# Patient Record
Sex: Male | Born: 1990 | Race: Black or African American | Hispanic: No | Marital: Single | Smoking: Current every day smoker
Health system: Southern US, Community
[De-identification: ages and names within clinical notes are randomized; demographics above are authoritative.]

## PROBLEM LIST (undated history)

## (undated) HISTORY — PX: HERNIA REPAIR: SHX51

---
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
## 2018-12-05 ENCOUNTER — Other Ambulatory Visit: Payer: Self-pay

## 2018-12-05 ENCOUNTER — Emergency Department: Payer: BC Managed Care – PPO

## 2018-12-05 ENCOUNTER — Emergency Department
Admission: EM | Admit: 2018-12-05 | Discharge: 2018-12-05 | Disposition: A | Discharge status: Home / Self Care | Payer: BC Managed Care – PPO | Attending: Emergency Medicine | Admitting: Emergency Medicine

## 2018-12-05 DIAGNOSIS — F17210 Nicotine dependence, cigarettes, uncomplicated: Secondary | ICD-10-CM | POA: Diagnosis not present

## 2018-12-05 DIAGNOSIS — R42 Dizziness and giddiness: Secondary | ICD-10-CM | POA: Insufficient documentation

## 2018-12-05 DIAGNOSIS — G441 Vascular headache, not elsewhere classified: Secondary | ICD-10-CM | POA: Diagnosis not present

## 2018-12-05 DIAGNOSIS — R51 Headache: Secondary | ICD-10-CM | POA: Diagnosis present

## 2018-12-05 LAB — CBC
HCT: 49.3 % (ref 39.0–52.0)
Hemoglobin: 16.6 g/dL (ref 13.0–17.0)
MCH: 29.3 pg (ref 26.0–34.0)
MCHC: 33.7 g/dL (ref 30.0–36.0)
MCV: 86.9 fL (ref 80.0–100.0)
Platelets: 227 10*3/uL (ref 150–400)
RBC: 5.67 MIL/uL (ref 4.22–5.81)
RDW: 12.9 % (ref 11.5–15.5)
WBC: 5.2 10*3/uL (ref 4.0–10.5)
nRBC: 0 % (ref 0.0–0.2)

## 2018-12-05 LAB — BASIC METABOLIC PANEL
Anion gap: 8 (ref 5–15)
BUN: 13 mg/dL (ref 6–20)
CO2: 25 mmol/L (ref 22–32)
Calcium: 9.2 mg/dL (ref 8.9–10.3)
Chloride: 109 mmol/L (ref 98–111)
Creatinine, Ser: 1.17 mg/dL (ref 0.61–1.24)
GFR calc Af Amer: >60 mL/min (ref >60)
GFR calc non Af Amer: >60 mL/min (ref >60)
Glucose, Bld: 106 mg/dL — ABNORMAL HIGH (ref 70–99)
Potassium: 3.8 mmol/L (ref 3.5–5.1)
Sodium: 142 mmol/L (ref 135–145)

## 2018-12-05 MED ORDER — SODIUM CHLORIDE 0.9% FLUSH: 3.0000 mL | Freq: Once | INTRAVENOUS | Status: DC

## 2018-12-05 NOTE — Discharge Instructions (Addendum)
Advised to follow-up with neurology for definitive evaluation of your head pain.  Your CT scan was unremarkable.  Your labs were unremarkable.

## 2018-12-05 NOTE — ED Provider Notes (Signed)
[facility] Emergency Department Provider Note   ____________________________________________   First MD Initiated Contact with Patient 12/05/18 1149     (approximate)  I have reviewed the triage vital signs and the nursing notes.   HISTORY  Chief Complaint Headache and Dizziness    HPI Ricardo Keller is a 28 y.o. male patient complaining of right side superior head pain with dizziness and blurry vision.  Patient denies any recent trauma but states he did play football and boxing but has not been diagnosed with a concussion.  Patient saw his PCP for this complaint was told to come to ED for definitive evaluation.         History reviewed. No pertinent past medical history.  There are no active problems to display for this patient.   Past Surgical History:  Procedure Laterality Date  . HERNIA REPAIR      Prior to Admission medications   Not on File    Allergies Patient has no known allergies.  No family history on file.  Social History Social History   Tobacco Use  . Smoking status: Current Every Day Smoker    Types: Cigarettes  . Smokeless tobacco: Never Used  Substance Use Topics  . Alcohol use: Yes  . Drug use: Not Currently    Review of Systems Constitutional: No fever/chills Eyes: No visual changes.  Blurry vision ENT: No sore throat. Cardiovascular: Denies chest pain. Respiratory: Denies shortness of breath. Gastrointestinal: No abdominal pain.  No nausea, no vomiting.  No diarrhea.  No constipation. Genitourinary: Negative for dysuria. Musculoskeletal: Negative for back pain. Skin: Negative for rash. Neurological: Negative for headaches, focal weakness or numbness.  Mild vertigo   ____________________________________________   PHYSICAL EXAM:  VITAL SIGNS: ED Triage Vitals  Enc Vitals Group     BP 12/05/18 1056 121/83     Pulse Rate 12/05/18 1056 63     Resp 12/05/18 1056 18     Temp 12/05/18 1056 98.3 F  (36.8 C)     Temp Source 12/05/18 1056 Oral     SpO2 12/05/18 1056 99 %     Weight 12/05/18 1057 260 lb (117.9 kg)     Height 12/05/18 1057 6\' 1"  (1.854 m)     Head Circumference --      Peak Flow --      Pain Score 12/05/18 1057 0     Pain Loc --      Pain Edu? --      Excl. in GC? --    Constitutional: Alert and oriented. Well appearing and in no acute distress. Eyes: Conjunctivae are normal. PERRL. EOMI. visual acuity is 20/20 bilaterally. Head: Atraumatic. Neck: No stridor.  No cervical spine tenderness to palpation. Hematological/Lymphatic/Immunilogical: No cervical lymphadenopathy. Cardiovascular: Normal rate, regular rhythm. Grossly normal heart sounds.  Good peripheral circulation. Respiratory: Normal respiratory effort.  No retractions. Lungs CTAB. Neurologic:  Normal speech and language. No gross focal neurologic deficits are appreciated. No gait instability. Skin:  Skin is warm, dry and intact. No rash noted. Psychiatric: Mood and affect are normal. Speech and behavior are normal.  ____________________________________________   LABS (all labs ordered are listed, but only abnormal results are displayed)  Labs Reviewed  BASIC METABOLIC PANEL - Abnormal; Notable for the following components:      Result Value   Glucose, Bld 106 (*)    All other components within normal limits  CBC  URINALYSIS, COMPLETE (UACMP) WITH MICROSCOPIC  CBG MONITORING, ED  ____________________________________________  EKG   ____________________________________________  RADIOLOGY  ED MD interpretation:    Official radiology report(s): Ct Head Wo Contrast  Result Date: 12/05/2018 CLINICAL DATA:  Right parietal headache for 1 month. Dizziness. EXAM: CT HEAD WITHOUT CONTRAST TECHNIQUE: Contiguous axial images were obtained from the base of the skull through the vertex without intravenous contrast. COMPARISON:  None. FINDINGS: Brain: There is no evidence of acute infarct, intracranial  hemorrhage, mass, midline shift, or extra-axial fluid collection. The ventricles and sulci are normal. Vascular: No suspicious vascular density. Skull: No fracture or focal osseous lesion. Sinuses/Orbits: Visualized paranasal sinuses and mastoid air cells are clear. Visualized orbits are unremarkable. Other: None. IMPRESSION: Negative head CT. Electronically Signed   By: Logan Bores M.D.   On: 12/05/2018 12:36    ____________________________________________   PROCEDURES  Procedure(s) performed (including Critical Care):  Procedures   ____________________________________________   INITIAL IMPRESSION / ASSESSMENT AND PLAN / ED COURSE  As part of my medical decision making, I reviewed the following data within the [facility]         Patient complain right upper head pain, intermittent vertigo, and blurry vision.  Physical exam was grossly unremarkable.  Patient CT scan was negative.  Patient given discharge care instruction advised follow-up neurology for definitive evaluation.      ____________________________________________   FINAL CLINICAL IMPRESSION(S) / ED DIAGNOSES  Final diagnoses:  Other vascular headache     ED Discharge Orders    None       Note:  This document was prepared using Dragon voice recognition software and may include unintentional dictation errors.    Sable Feil, PA-C 12/05/18 1306    Duffy Bruce, MD 12/08/18 [phone]

## 2018-12-05 NOTE — ED Notes (Signed)
Pt states sharp pain on right side of head with c/o of stiff neck. Pt states vision changes that come and go as well. Pt A&O x4 and ambulatory to flex ED room.

## 2018-12-05 NOTE — ED Notes (Signed)
Pt verbalized understanding of discharge instructions. NAD at this time. 

## 2018-12-05 NOTE — ED Triage Notes (Signed)
Pt c/o left sided HA with dizziness and changes in vision over the past year. Denies any injury., states he did play football and boxing but has never been to a doctor for a concussion. Pt is a/ox4 on arrival, ambulatory to triage without difficulty

## 2019-01-30 ENCOUNTER — Other Ambulatory Visit: Payer: Self-pay | Admitting: Family Medicine

## 2019-01-30 DIAGNOSIS — G4485 Primary stabbing headache: Secondary | ICD-10-CM

## 2019-01-30 DIAGNOSIS — R42 Dizziness and giddiness: Secondary | ICD-10-CM

## 2019-01-30 DIAGNOSIS — H538 Other visual disturbances: Secondary | ICD-10-CM

## 2019-02-24 ENCOUNTER — Ambulatory Visit
Admission: RE | Admit: 2019-02-24 | Discharge: 2019-02-24 | Disposition: A | Discharge status: Home / Self Care | Payer: BC Managed Care – PPO | Source: Ambulatory Visit | Attending: Family Medicine | Admitting: Family Medicine

## 2019-02-24 ENCOUNTER — Other Ambulatory Visit: Payer: Self-pay

## 2019-02-24 DIAGNOSIS — G4485 Primary stabbing headache: Secondary | ICD-10-CM

## 2019-02-24 DIAGNOSIS — H538 Other visual disturbances: Secondary | ICD-10-CM

## 2019-02-24 DIAGNOSIS — R42 Dizziness and giddiness: Secondary | ICD-10-CM

## 2019-02-24 MED ORDER — GADOBENATE DIMEGLUMINE 529 MG/ML IV SOLN
20.0000 mL | Freq: Once | INTRAVENOUS | Status: AC | PRN
  Administered 2019-02-24: 13:00:00 20 mL via INTRAVENOUS

## 2020-11-25 IMAGING — MR MR HEAD WO/W CM
1 series · 15 of 35 positions shown · IV contrast (20 ml multihance)
Comparison: Head CT 12/05/2018.

CLINICAL DATA: 28-year-old male with chronic intermittent stabbing
headache with blurred vision. Pressure in the back of the head,
right side pain. No known injury.

EXAM:
MRI HEAD WITHOUT AND WITH CONTRAST
TECHNIQUE: Multiplanar, multiecho pulse sequences of the brain and surrounding
structures were obtained without and with intravenous contrast.
CONTRAST:  20mL MULTIHANCE GADOBENATE DIMEGLUMINE 529 MG/ML IV SOLN

[Series 13: T2 post-contrast · coronal · 4.0mm · 0.36mm/px · 15 of 35 slices shown]
[im 1/35]
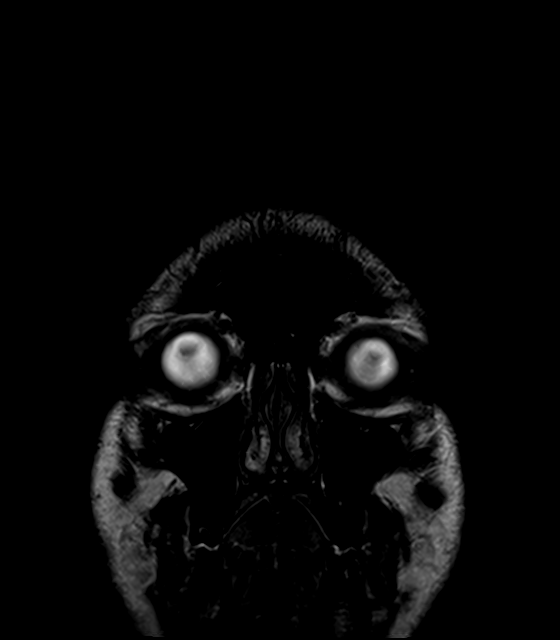
[im 2/35]
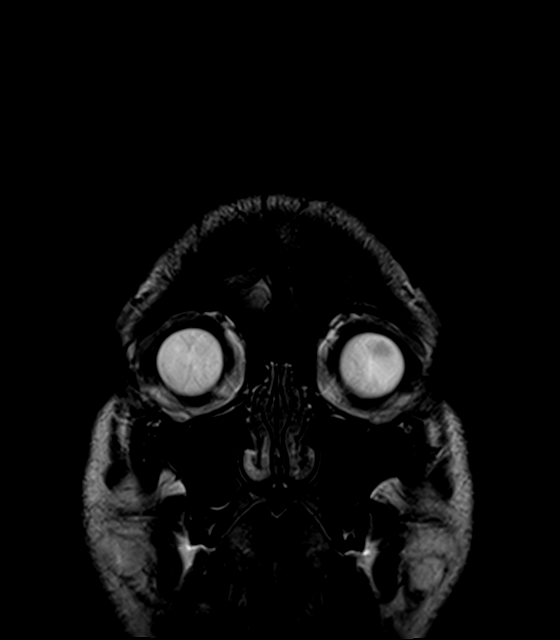
[im 3/35]
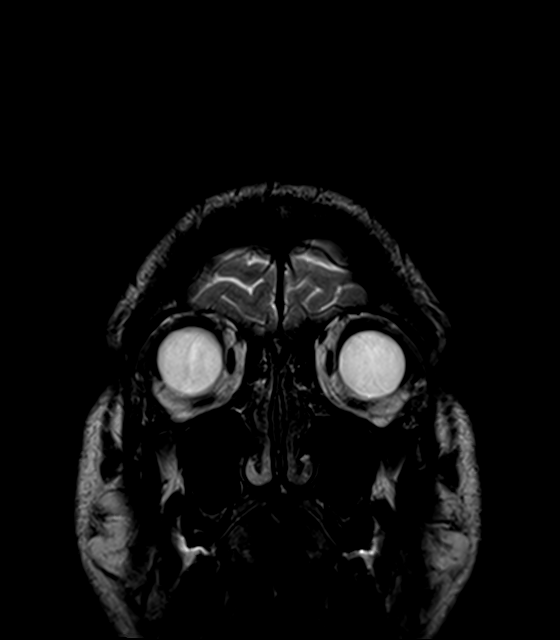
[im 4/35]
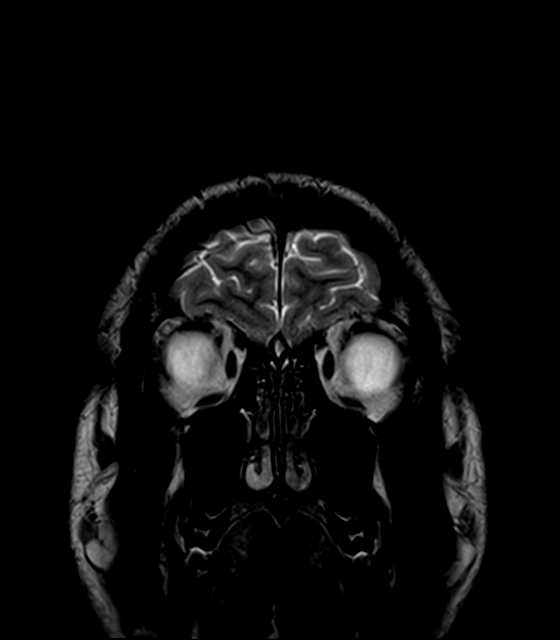
[im 5/35]
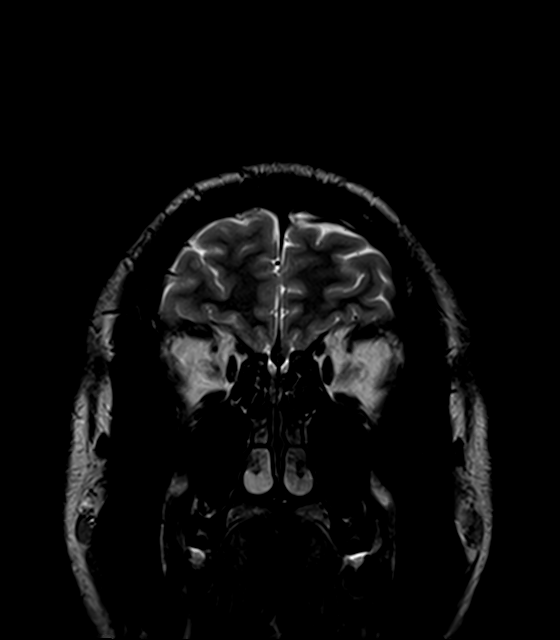
[im 6/35]
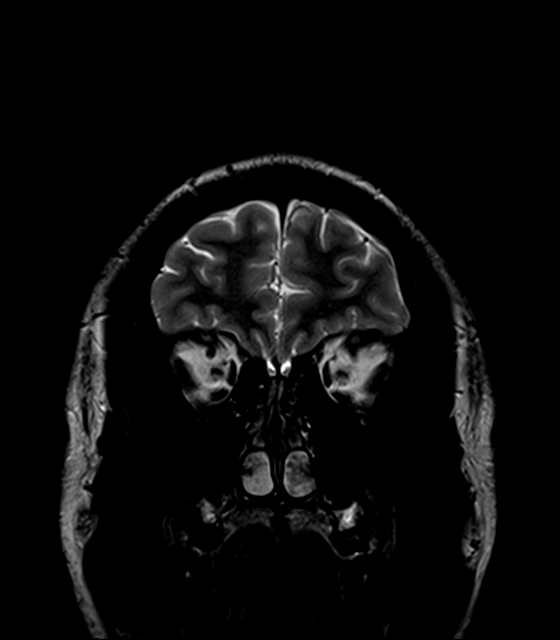
[im 7/35]
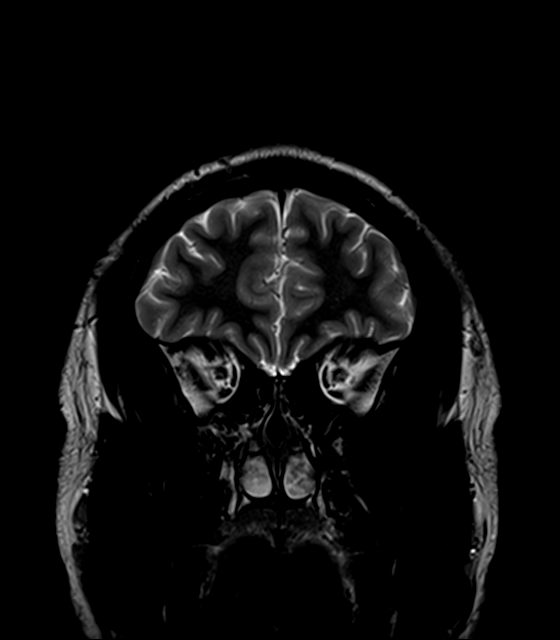
[im 11/35]
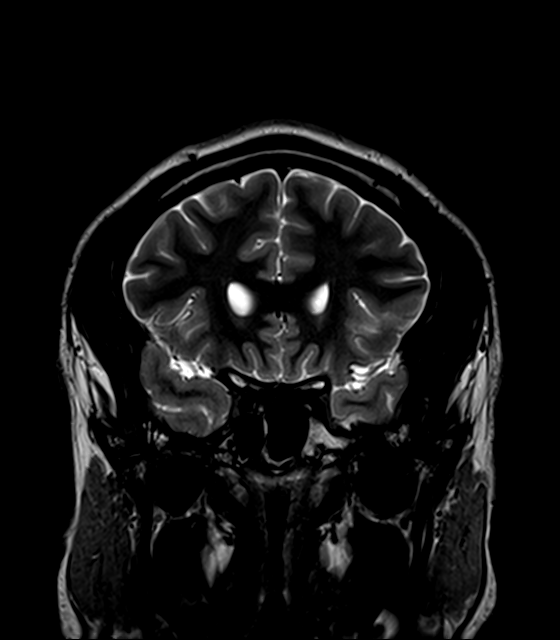
[im 16/35]
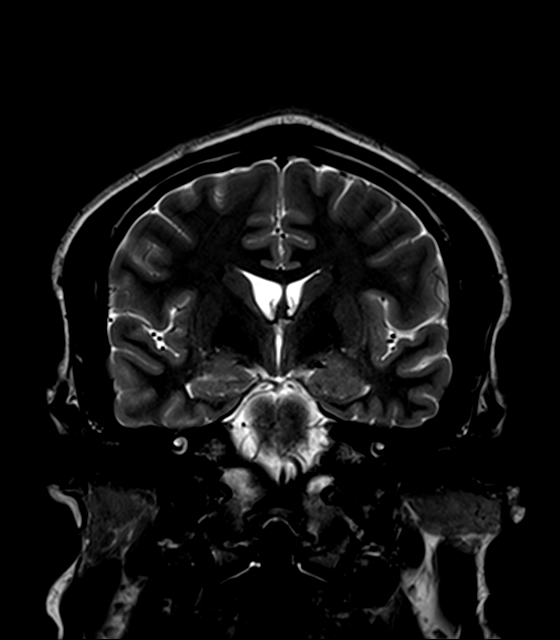
[im 18/35]
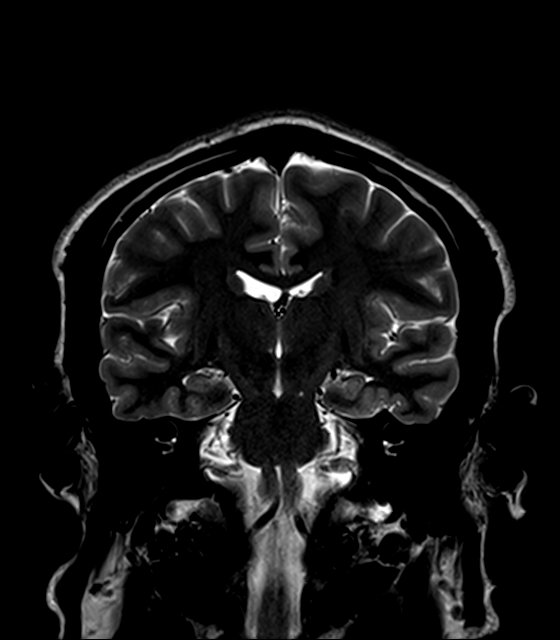
[im 20/35]
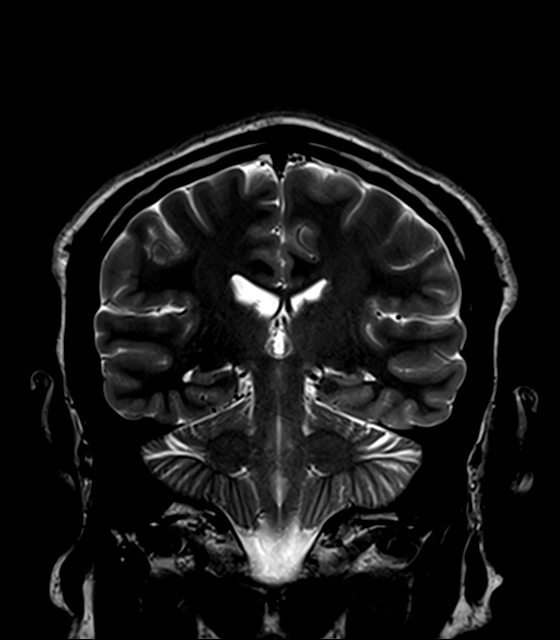
[im 25/35]
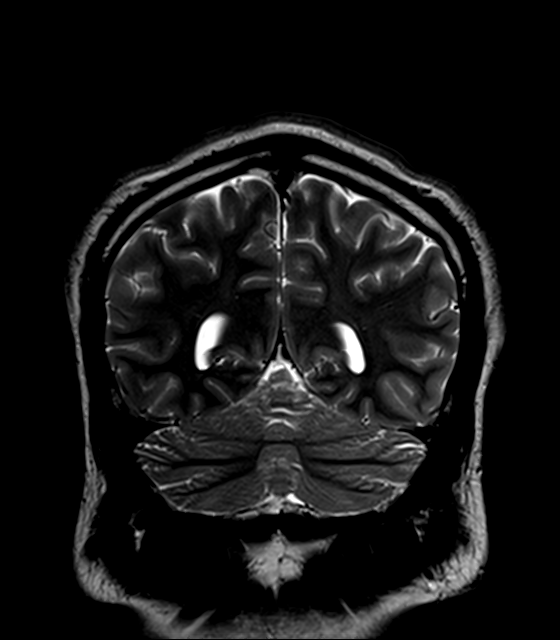
[im 29/35]
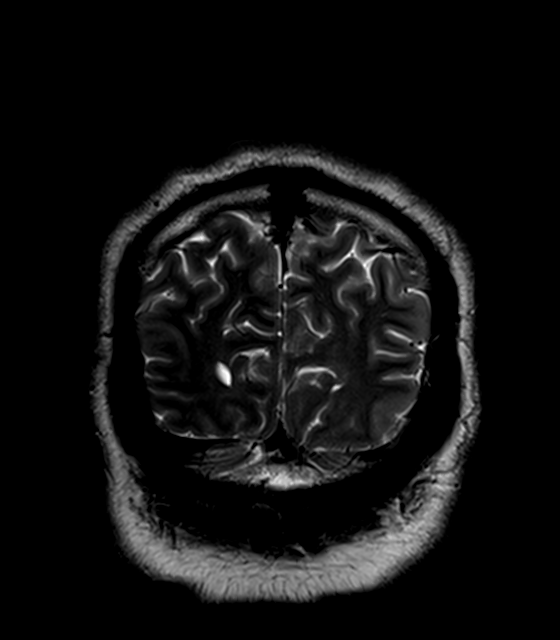
[im 30/35]
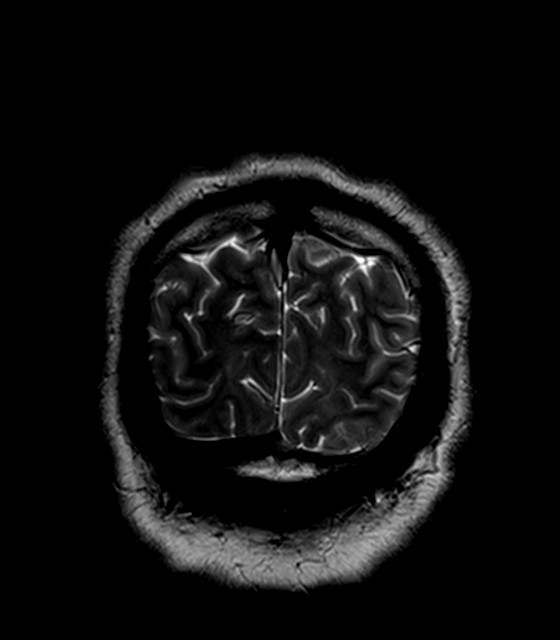
[im 33/35]
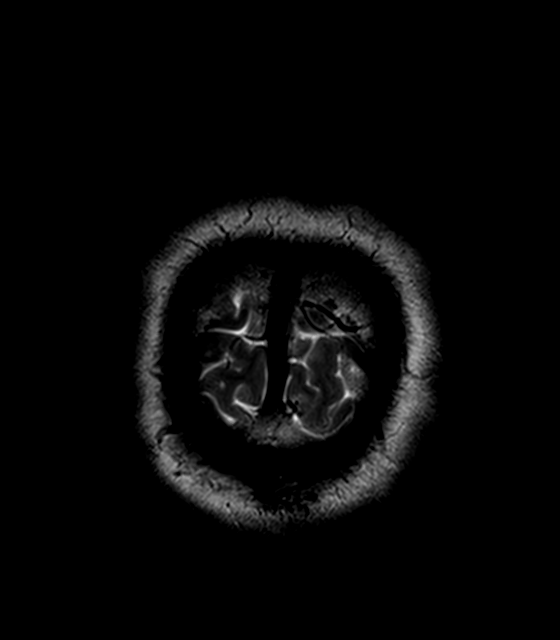

[15 of 35 positions shown; findings below may reference images not displayed]

FINDINGS: Brain: Cerebral volume is within normal limits. No restricted
diffusion to suggest acute infarction. No midline shift, mass
effect, evidence of mass lesion, ventriculomegaly, extra-axial
collection or acute intracranial hemorrhage. Cervicomedullary
junction and pituitary are within normal limits.

Gray and white matter signal is within normal limits throughout the
brain. No encephalomalacia or chronic cerebral blood products
identified.

No abnormal enhancement identified. No dural thickening.

Vascular: Major intracranial vascular flow voids are preserved. The
major dural venous sinuses are enhancing and appear to be patent.
Left transverse sinus arachnoid granulation (normal finding - series
14, image 47).

Skull and upper cervical spine: Normal visible cervical spine.
Visualized bone marrow signal is within normal limits.

Sinuses/Orbits: Negative orbits. Paranasal sinuses are clear.

Other: Visible internal auditory structures appear normal. Mastoids
are clear. Scalp and face soft tissues appear negative.
IMPRESSION: Normal MRI appearance of the brain.  No explanation for headaches.
